# Patient Record
Sex: Male | Born: 1990 | Hispanic: Yes | Marital: Single | Smoking: Never smoker
Health system: Southern US, Community
[De-identification: ages and names within clinical notes are randomized; demographics above are authoritative.]

---
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
## 2013-11-05 ENCOUNTER — Emergency Department: Payer: Self-pay | Admitting: Emergency Medicine

## 2014-11-24 ENCOUNTER — Emergency Department
Admission: EM | Admit: 2014-11-24 | Discharge: 2014-11-24 | Disposition: A | Discharge status: Home / Self Care | Payer: Self-pay | Attending: Emergency Medicine | Admitting: Emergency Medicine

## 2014-11-24 DIAGNOSIS — R42 Dizziness and giddiness: Secondary | ICD-10-CM | POA: Insufficient documentation

## 2014-11-24 LAB — URINALYSIS COMPLETE WITH MICROSCOPIC (ARMC ONLY)
BILIRUBIN URINE: NEGATIVE
Bacteria, UA: NONE SEEN
GLUCOSE, UA: NEGATIVE mg/dL
Hgb urine dipstick: NEGATIVE
KETONES UR: NEGATIVE mg/dL
Nitrite: NEGATIVE
PH: 6 (ref 5.0–8.0)
Protein, ur: NEGATIVE mg/dL
SQUAMOUS EPITHELIAL / LPF: NONE SEEN
Specific Gravity, Urine: 1.005 (ref 1.005–1.030)

## 2014-11-24 LAB — CBC WITH DIFFERENTIAL/PLATELET
Basophils Absolute: 0 10*3/uL (ref 0–0.1)
Basophils Relative: 0 %
Eosinophils Absolute: 0.1 10*3/uL (ref 0–0.7)
Eosinophils Relative: 1 %
HCT: 49.5 % (ref 40.0–52.0)
Hemoglobin: 16.5 g/dL (ref 13.0–18.0)
LYMPHS PCT: 13 %
Lymphs Abs: 1 10*3/uL (ref 1.0–3.6)
MCH: 29.6 pg (ref 26.0–34.0)
MCHC: 33.3 g/dL (ref 32.0–36.0)
MCV: 88.9 fL (ref 80.0–100.0)
MONO ABS: 0.4 10*3/uL (ref 0.2–1.0)
Monocytes Relative: 6 %
NEUTROS ABS: 5.9 10*3/uL (ref 1.4–6.5)
Neutrophils Relative %: 80 %
PLATELETS: 172 10*3/uL (ref 150–440)
RBC: 5.57 MIL/uL (ref 4.40–5.90)
RDW: 12.4 % (ref 11.5–14.5)
WBC: 7.4 10*3/uL (ref 3.8–10.6)

## 2014-11-24 LAB — COMPREHENSIVE METABOLIC PANEL
ALK PHOS: 84 U/L (ref 38–126)
ALT: 17 U/L (ref 17–63)
AST: 21 U/L (ref 15–41)
Albumin: 4.4 g/dL (ref 3.5–5.0)
Anion gap: 7 (ref 5–15)
BILIRUBIN TOTAL: 2.2 mg/dL — AB (ref 0.3–1.2)
BUN: 18 mg/dL (ref 6–20)
CALCIUM: 9.3 mg/dL (ref 8.9–10.3)
CO2: 28 mmol/L (ref 22–32)
Chloride: 104 mmol/L (ref 101–111)
Creatinine, Ser: 1.04 mg/dL (ref 0.61–1.24)
GFR calc Af Amer: >60 mL/min (ref >60)
Glucose, Bld: 105 mg/dL — ABNORMAL HIGH (ref 65–99)
POTASSIUM: 3.8 mmol/L (ref 3.5–5.1)
Sodium: 139 mmol/L (ref 135–145)
TOTAL PROTEIN: 7.3 g/dL (ref 6.5–8.1)

## 2014-11-24 LAB — CK: CK TOTAL: 86 U/L (ref 49–397)

## 2014-11-24 MED ORDER — SODIUM CHLORIDE 0.9 % IV BOLUS (SEPSIS)
1000.0000 mL | Freq: Once | INTRAVENOUS | Status: AC
  Administered 2014-11-24: 1000 mL via INTRAVENOUS

## 2014-11-24 NOTE — ED Provider Notes (Signed)
[facility] Emergency Department Provider Note  ____________________________________________   I have reviewed the triage vital signs and the nursing notes.   HISTORY  Chief Complaint Near Syncope    HPI Nicholas Lucas is a 24 y.o. male who is healthy, did bruise his tailbone about a week ago and has been on tramadol and Motrin for this, he said he was in his normal state of health yesterday. He has not had any actual food to eat since a partially 4:00 yesterday or 18 hours ago. He had an apple yesterday evening. States he just did not get around to eating. He states he woke up this morning and felt lightheaded. EMS was called, appear that he may be having a panic attack. A friend, who is a doctor, thought that he may have had a vagal event. Patient this time has no symptoms but he did feel lightheaded again in the waiting room. He has had no vomiting he felt slightly nauseated. He has had no fever no chills no headache no stiff neck no GI bleed symptoms no melena no bright red blood per rectum or hematemesis. He has no complaints of true vertigo. He did not hit his head or actually pass out. His family has no history of early cardiac death. The patient has no symptoms of DVT and he has not been short of breath. He denies any focal neurologic deficit  History reviewed. No pertinent past medical history.  There are no active problems to display for this patient.   History reviewed. No pertinent past surgical history.  No current outpatient prescriptions on file.  Allergies Review of patient's allergies indicates no known allergies.  History reviewed. No pertinent family history.  Social History Social History  Substance Use Topics  . Smoking status: Never Smoker   . Smokeless tobacco: None  . Alcohol Use: No    Review of Systems Constitutional: No fever/chills Eyes: No visual changes. ENT: No sore throat. No stiff neck no neck pain Cardiovascular:  Denies chest pain. Respiratory: Denies shortness of breath. Gastrointestinal:   no vomiting.  No diarrhea.  No constipation. Genitourinary: Negative for dysuria. Musculoskeletal: Negative lower extremity swelling Skin: Negative for rash. Neurological: Negative for headaches, focal weakness or numbness. 10-point ROS otherwise negative.  ____________________________________________   PHYSICAL EXAM:  VITAL SIGNS: ED Triage Vitals  Enc Vitals Group     BP 11/24/14 0940 123/62 mmHg     Pulse Rate 11/24/14 0940 71     Resp 11/24/14 0940 16     Temp 11/24/14 0940 97.6 F (36.4 C)     Temp Source 11/24/14 0940 Oral     SpO2 11/24/14 0940 96 %     Weight 11/24/14 0940 160 lb (72.576 kg)     Height 11/24/14 0940 5\' 7"  (1.702 m)     Head Cir --      Peak Flow --      Pain Score --      Pain Loc --      Pain Edu? --      Excl. in GC? --     Constitutional: Alert and oriented. Well appearing and in no acute distress. smiling and talking to me with no evidence of discomfort or symptomology  Eyes: Conjunctivae are normal. PERRL. EOMI. Head: Atraumatic. Nose: No congestion/rhinnorhea. Mouth/Throat: Mucous membranes are moist.  Oropharynx non-erythematous. Neck: No stridor.   Nontender with no meningismus Cardiovascular: Normal rate, regular rhythm. Grossly normal heart sounds.  Good peripheral circulation. Respiratory: Normal  respiratory effort.  No retractions. Lungs CTAB. Gastrointestinal: Soft and nontender. No distention. No guarding no rebound Back:  There is no focal tenderness or step off there is no midline tenderness there are no lesions noted. there is no CVA tenderness Musculoskeletal: No lower extremity tenderness. No joint effusions, no DVT signs strong distal pulses no edema Cranial nerves II through XII are grossly intact 5 out of 5 strength bilateral upper and lower extremity. Finger to nose within normal limits heel to shin within normal limits, speech is normal with no  word finding difficulty ordered dysarthria, reflexes symmetric pupils are equally round and reactive to light there is no pronator drift, sensation is normal, normal neurologic exam Skin:  Skin is warm, dry and intact. No rash noted. Psychiatric: Mood and affect are normal. Speech and behavior are normal.  ____________________________________________   LABS (all labs ordered are listed, but only abnormal results are displayed)  Labs Reviewed  CBC WITH DIFFERENTIAL/PLATELET  CK  URINALYSIS COMPLETEWITH MICROSCOPIC ([facility] ONLY)  COMPREHENSIVE METABOLIC PANEL   ____________________________________________  EKG  I PERSONALLY INTERPRETED EKG ____________________________________________  RADIOLOGY   ____________________________________________   PROCEDURES  Procedure(s) performed: None  Critical Care performed: None  ____________________________________________   INITIAL IMPRESSION / ASSESSMENT AND PLAN / ED COURSE  Pertinent labs & imaging results that were available during my care of the patient were reviewed by me and considered in my medical decision making (see chart for detail)  Healthy young mfelt lightheaded this morning. Has not had anything to eat or drink and almost 20 hours besides an apple. We'll give him IV hydration, we'll check to make sure he does not have a long QT although there is no family history, we'll check basic blood work to make sure he does not have renal impairment from taking Motrin, his exam is otherwise reassuring his vital signs are good.   ____________________________________________   FINAL CLINICAL IMPRESSION(S) / ED DIAGNOSES  Final diagnoses:  None     Jeanmarie Plant, MD 11/24/14 1007

## 2014-11-24 NOTE — ED Notes (Signed)
Pt reports waking up, standing up to walk when he became dizzy and felt his heart racing.  Pt's mother reports that he became extremely pale and had pre-syncopal episode.  Pt is now still feeling slightly dizzy.

## 2014-11-24 NOTE — ED Notes (Addendum)
Patient states " I feel dizzy and like I could pass out". Patient reports dizzy episode 3 times this morning.  Patient called EMS but refused to comes in because he felt better.  Patient is currently taking Tramadol 50 mg PO and Naproxen 500 mg for sports related injury.  Patient has been taking medications since 11/15/2014.

## 2014-11-29 ENCOUNTER — Emergency Department: Payer: Self-pay

## 2014-11-29 ENCOUNTER — Emergency Department
Admission: EM | Admit: 2014-11-29 | Discharge: 2014-11-29 | Disposition: A | Discharge status: Home / Self Care | Payer: Self-pay | Attending: Emergency Medicine | Admitting: Emergency Medicine

## 2014-11-29 ENCOUNTER — Encounter: Payer: Self-pay | Admitting: Emergency Medicine

## 2014-11-29 DIAGNOSIS — R42 Dizziness and giddiness: Secondary | ICD-10-CM | POA: Insufficient documentation

## 2014-11-29 DIAGNOSIS — R17 Unspecified jaundice: Secondary | ICD-10-CM

## 2014-11-29 DIAGNOSIS — R6883 Chills (without fever): Secondary | ICD-10-CM | POA: Insufficient documentation

## 2014-11-29 DIAGNOSIS — R079 Chest pain, unspecified: Secondary | ICD-10-CM | POA: Insufficient documentation

## 2014-11-29 DIAGNOSIS — R0602 Shortness of breath: Secondary | ICD-10-CM | POA: Insufficient documentation

## 2014-11-29 LAB — CBC
HCT: 49.7 % (ref 40.0–52.0)
Hemoglobin: 17 g/dL (ref 13.0–18.0)
MCH: 30.2 pg (ref 26.0–34.0)
MCHC: 34.1 g/dL (ref 32.0–36.0)
MCV: 88.5 fL (ref 80.0–100.0)
PLATELETS: 190 10*3/uL (ref 150–440)
RBC: 5.62 MIL/uL (ref 4.40–5.90)
RDW: 12.5 % (ref 11.5–14.5)
WBC: 7.8 10*3/uL (ref 3.8–10.6)

## 2014-11-29 LAB — COMPREHENSIVE METABOLIC PANEL
ALT: 16 U/L — ABNORMAL LOW (ref 17–63)
ANION GAP: 6 (ref 5–15)
AST: 18 U/L (ref 15–41)
Albumin: 4.8 g/dL (ref 3.5–5.0)
Alkaline Phosphatase: 83 U/L (ref 38–126)
BILIRUBIN TOTAL: 2 mg/dL — AB (ref 0.3–1.2)
BUN: 16 mg/dL (ref 6–20)
CHLORIDE: 105 mmol/L (ref 101–111)
CO2: 29 mmol/L (ref 22–32)
Calcium: 9.6 mg/dL (ref 8.9–10.3)
Creatinine, Ser: 0.89 mg/dL (ref 0.61–1.24)
GFR calc Af Amer: >60 mL/min (ref >60)
Glucose, Bld: 105 mg/dL — ABNORMAL HIGH (ref 65–99)
POTASSIUM: 4.4 mmol/L (ref 3.5–5.1)
Sodium: 140 mmol/L (ref 135–145)
TOTAL PROTEIN: 7.4 g/dL (ref 6.5–8.1)

## 2014-11-29 LAB — TROPONIN I

## 2014-11-29 NOTE — ED Provider Notes (Signed)
[facility] Emergency Department Provider Note  ____________________________________________  Time seen: Approximately 614 AM  I have reviewed the triage vital signs and the nursing notes.   HISTORY  Chief Complaint Chest Pain   HPI Nicholas HatterJorge Lucas is a 24 y.o. male who comes into the hospital with chest pain. The patient reports that while he was sitting and watching TV around 10 or 11 he started having some chest pain and felt as though his heart rate was slowing down. The patient reports that he was here for the same thing last week. He reports that when he has the symptoms he feels chills starting from his feet all the way up his body and his heart start beating fast and then he has some chest pain. The patient reports that he's had constant chest pain since last week and is a 6 out of 10 in intensity. The patient reports that the pain is sharp. The patient has not followed up with cardiology as he thought the pain would just go away and he was waiting it out. The patient reports that he feels sore in his chest. He denies any nausea or vomiting but has had some dizziness. The patient reports that he had been taking pain medication prior to this and has had some constipation due to that. The patient reports he is also had some mild shortness of breath with no pain with inspiration.   History reviewed. No pertinent past medical history.  There are no active problems to display for this patient.   History reviewed. No pertinent past surgical history.  Current Outpatient Rx  Name  Route  Sig  Dispense  Refill  . naproxen (NAPROSYN) 500 MG tablet   Oral   Take 500 mg by mouth 2 (two) times daily as needed for mild pain or moderate pain.          . traMADol (ULTRAM) 50 MG tablet   Oral   Take 50 mg by mouth every 6 (six) hours as needed for moderate pain or severe pain.           Allergies Review of patient's allergies indicates no known allergies.  No  family history on file.  Social History Social History  Substance Use Topics  . Smoking status: Never Smoker   . Smokeless tobacco: Never Used  . Alcohol Use: No    Review of Systems Constitutional: chills Eyes: No visual changes. ENT: No sore throat. Cardiovascular:chest pain. Respiratory:  shortness of breath. Gastrointestinal: No abdominal pain.  No nausea, no vomiting.  No diarrhea.  No constipation. Genitourinary: Negative for dysuria. Musculoskeletal: Negative for back pain. Skin: Negative for rash. Neurological: Dizziness 10-point ROS otherwise negative.  ____________________________________________   PHYSICAL EXAM:  VITAL SIGNS: ED Triage Vitals  Enc Vitals Group     BP 11/29/14 0214 125/72 mmHg     Pulse Rate 11/29/14 0214 69     Resp 11/29/14 0214 22     Temp 11/29/14 0214 98.4 F (36.9 C)     Temp Source 11/29/14 0214 Oral     SpO2 11/29/14 0214 99 %     Weight 11/29/14 0214 160 lb (72.576 kg)     Height 11/29/14 0214 5\' 7"  (1.702 m)     Head Cir --      Peak Flow --      Pain Score 11/29/14 0211 6     Pain Loc --      Pain Edu? --  Excl. in GC? --     Constitutional: Alert and oriented. Well appearing and in mild distress. Eyes: Conjunctivae are normal. PERRL. EOMI. Head: Atraumatic. Nose: No congestion/rhinnorhea. Mouth/Throat: Mucous membranes are moist.  Oropharynx non-erythematous. Cardiovascular: Normal rate, regular rhythm. Grossly normal heart sounds.  Good peripheral circulation. Respiratory: Normal respiratory effort.  No retractions. Lungs CTAB. Gastrointestinal: Soft and nontender. No distention. Positive bowel sounds Musculoskeletal: No lower extremity tenderness nor edema.   Neurologic:  Normal speech and language. No gross focal neurologic deficits are appreciated.  Skin:  Skin is warm, dry and intact.  Psychiatric: Mood and affect are normal.   ____________________________________________   LABS (all labs ordered are  listed, but only abnormal results are displayed)  Labs Reviewed  COMPREHENSIVE METABOLIC PANEL - Abnormal; Notable for the following:    Glucose, Bld 105 (*)    ALT 16 (*)    Total Bilirubin 2.0 (*)    All other components within normal limits  CBC  TROPONIN I   ____________________________________________  EKG  ED ECG REPORT I, Rebecka Apley, the attending physician, personally viewed and interpreted this ECG.   Date: 11/29/2014  EKG Time: 213  Rate: 87  Rhythm: normal sinus rhythm  Axis: normal  Intervals:none  ST&T Change: none  ____________________________________________  RADIOLOGY  CXR: No active cardiopulmonary disease [place] abd: Negatve ____________________________________________   PROCEDURES  Procedure(s) performed: None  Critical Care performed: No  ____________________________________________   INITIAL IMPRESSION / ASSESSMENT AND PLAN / ED COURSE  Pertinent labs & imaging results that were available during my care of the patient were reviewed by me and considered in my medical decision making (see chart for details).  This is a 24 year old male who comes in today with some chest pain. The patient reports that she did have these symptoms last week. He reports that he started having chills while watching TV this evening around 10 PM. The patient has had continued chest pain but has not followed up as he has been instructed to do by the physician who saw him last week. The patient's blood work is unremarkable and he is resting calmly without any acute distress at this time. I will discharge the patient to home and again encouraged him to follow up with cardiology for further evaluation of his chest pain. ____________________________________________   FINAL CLINICAL IMPRESSION(S) / ED DIAGNOSES  Final diagnoses:  Elevated bilirubin  Chest pain, unspecified chest pain type  Chills      Rebecka Apley, MD 11/29/14 [phone]

## 2014-11-29 NOTE — ED Notes (Signed)
Patient reports seen in ED last week for chest pain and near syncopal episode.  Patient reports continued upper chest pain since then that has been constant. Patient ambulatory without difficulty, speaking in complete sentences.

## 2014-11-29 NOTE — ED Notes (Signed)
Pt resting quietly in lobby with eyes closed, resp even and nonlabored 

## 2014-11-29 NOTE — Discharge Instructions (Signed)
Nonspecific Chest Pain  °Chest pain can be caused by many different conditions. There is always a chance that your pain could be related to something serious, such as a heart attack or a blood clot in your lungs. Chest pain can also be caused by conditions that are not life-threatening. If you have chest pain, it is very important to follow up with your health care provider. °CAUSES  °Chest pain can be caused by: °· Heartburn. °· Pneumonia or bronchitis. °· Anxiety or stress. °· Inflammation around your heart (pericarditis) or lung (pleuritis or pleurisy). °· A blood clot in your lung. °· A collapsed lung (pneumothorax). It can develop suddenly on its own (spontaneous pneumothorax) or from trauma to the chest. °· Shingles infection (varicella-zoster virus). °· Heart attack. °· Damage to the bones, muscles, and cartilage that make up your chest wall. This can include: °¨ Bruised bones due to injury. °¨ Strained muscles or cartilage due to frequent or repeated coughing or overwork. °¨ Fracture to one or more ribs. °¨ Sore cartilage due to inflammation (costochondritis). °RISK FACTORS  °Risk factors for chest pain may include: °· Activities that increase your risk for trauma or injury to your chest. °· Respiratory infections or conditions that cause frequent coughing. °· Medical conditions or overeating that can cause heartburn. °· Heart disease or family history of heart disease. °· Conditions or health behaviors that increase your risk of developing a blood clot. °· Having had chicken pox (varicella zoster). °SIGNS AND SYMPTOMS °Chest pain can feel like: °· Burning or tingling on the surface of your chest or deep in your chest. °· Crushing, pressure, aching, or squeezing pain. °· Dull or sharp pain that is worse when you move, cough, or take a deep breath. °· Pain that is also felt in your back, neck, shoulder, or arm, or pain that spreads to any of these areas. °Your chest pain may come and go, or it may stay  constant. °DIAGNOSIS °Lab tests or other studies may be needed to find the cause of your pain. Your health care provider may have you take a test called an ambulatory ECG (electrocardiogram). An ECG records your heartbeat patterns at the time the test is performed. You may also have other tests, such as: °· Transthoracic echocardiogram (TTE). During echocardiography, sound waves are used to create a picture of all of the heart structures and to look at how blood flows through your heart. °· Transesophageal echocardiogram (TEE). This is a more advanced imaging test that obtains images from inside your body. It allows your health care provider to see your heart in finer detail. °· Cardiac monitoring. This allows your health care provider to monitor your heart rate and rhythm in real time. °· Holter monitor. This is a portable device that records your heartbeat and can help to diagnose abnormal heartbeats. It allows your health care provider to track your heart activity for several days, if needed. °· Stress tests. These can be done through exercise or by taking medicine that makes your heart beat more quickly. °· Blood tests. °· Imaging tests. °TREATMENT  °Your treatment depends on what is causing your chest pain. Treatment may include: °· Medicines. These may include: °¨ Acid blockers for heartburn. °¨ Anti-inflammatory medicine. °¨ Pain medicine for inflammatory conditions. °¨ Antibiotic medicine, if an infection is present. °¨ Medicines to dissolve blood clots. °¨ Medicines to treat coronary artery disease. °· Supportive care for conditions that do not require medicines. This may include: °¨ Resting. °¨ Applying heat   or cold packs to injured areas. °¨ Limiting activities until pain decreases. °HOME CARE INSTRUCTIONS °· If you were prescribed an antibiotic medicine, finish it all even if you start to feel better. °· Avoid any activities that bring on chest pain. °· Do not use any tobacco products, including  cigarettes, chewing tobacco, or electronic cigarettes. If you need help quitting, ask your health care provider. °· Do not drink alcohol. °· Take medicines only as directed by your health care provider. °· Keep all follow-up visits as directed by your health care provider. This is important. This includes any further testing if your chest pain does not go away. °· If heartburn is the cause for your chest pain, you may be told to keep your head raised (elevated) while sleeping. This reduces the chance that acid will go from your stomach into your esophagus. °· Make lifestyle changes as directed by your health care provider. These may include: °¨ Getting regular exercise. Ask your health care provider to suggest some activities that are safe for you. °¨ Eating a heart-healthy diet. A registered dietitian can help you to learn healthy eating options. °¨ Maintaining a healthy weight. °¨ Managing diabetes, if necessary. °¨ Reducing stress. °SEEK MEDICAL CARE IF: °· Your chest pain does not go away after treatment. °· You have a rash with blisters on your chest. °· You have a fever. °SEEK IMMEDIATE MEDICAL CARE IF:  °· Your chest pain is worse. °· You have an increasing cough, or you cough up blood. °· You have severe abdominal pain. °· You have severe weakness. °· You faint. °· You have chills. °· You have sudden, unexplained chest discomfort. °· You have sudden, unexplained discomfort in your arms, back, neck, or jaw. °· You have shortness of breath at any time. °· You suddenly start to sweat, or your skin gets clammy. °· You feel nauseous or you vomit. °· You suddenly feel light-headed or dizzy. °· Your heart begins to beat quickly, or it feels like it is skipping beats. °These symptoms may represent a serious problem that is an emergency. Do not wait to see if the symptoms will go away. Get medical help right away. Call your local emergency services (911 in the [place] Do not drive yourself to the hospital. °  °This  information is not intended to replace advice given to you by your health care provider. Make sure you discuss any questions you have with your health care provider. °  °Document Released: 10/25/2004 Document Revised: 02/05/2014 Document Reviewed: 08/21/2013 °Elsevier Interactive Patient Education ©2016 [employer] ° °

## 2016-04-02 IMAGING — US US ABDOMEN LIMITED
1 series · 14 of 25 positions shown · non-contrast
Comparison: None.

CLINICAL DATA: Elevated liver function tests. Atypical chest pain
for 1 week.

EXAM:
US ABDOMEN LIMITED - RIGHT UPPER QUADRANT

[Series 1: us abdomen limited · 0.18mm/px · 14 of 34 slices shown]
[im 1/34]
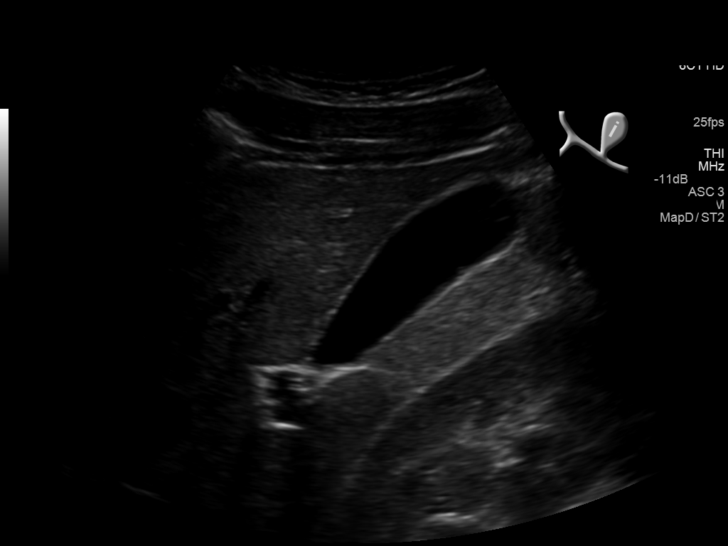
[im 3/34]
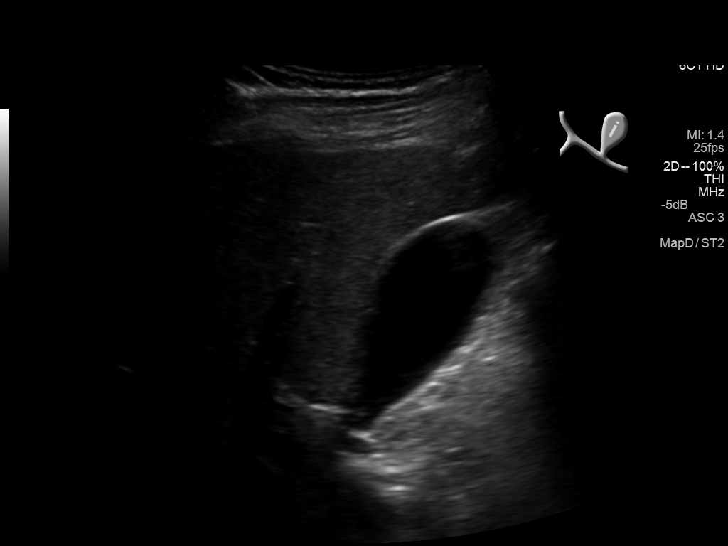
[im 6/34]
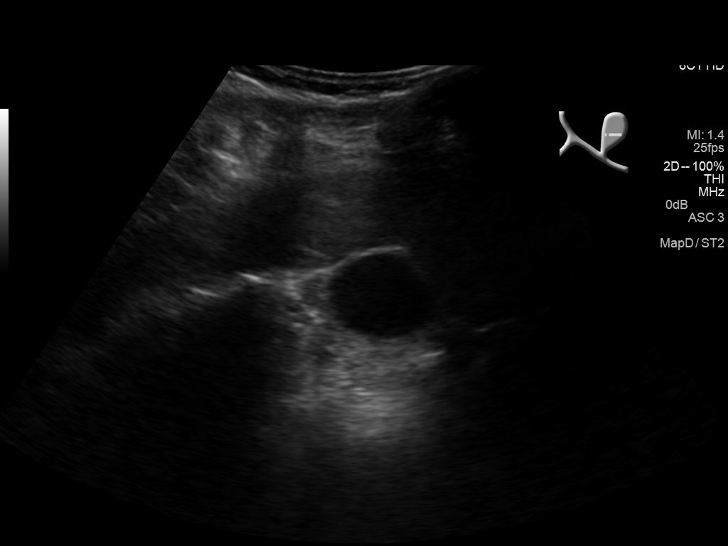
[im 9/34]
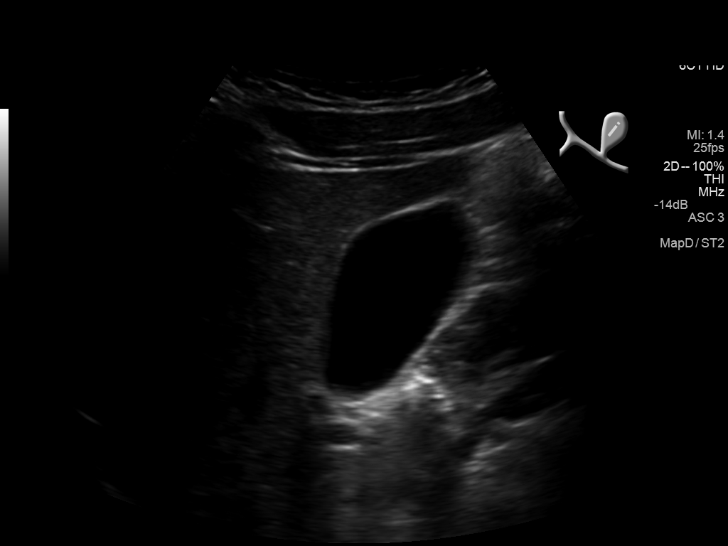
[im 12/34]
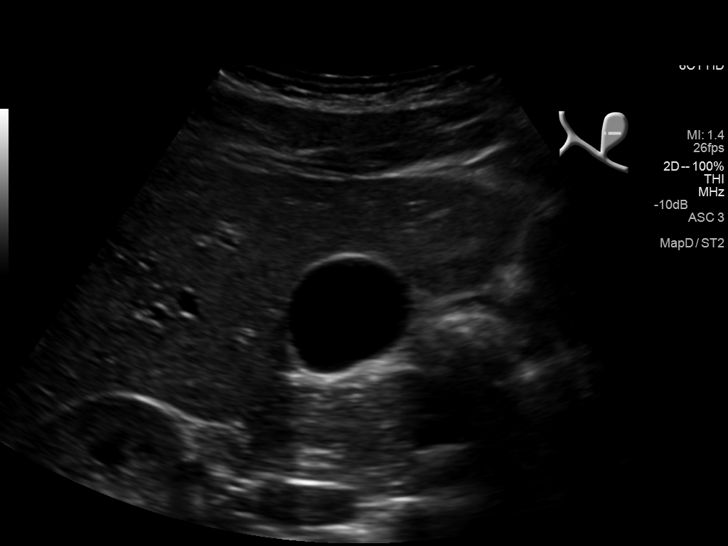
[im 13/34]
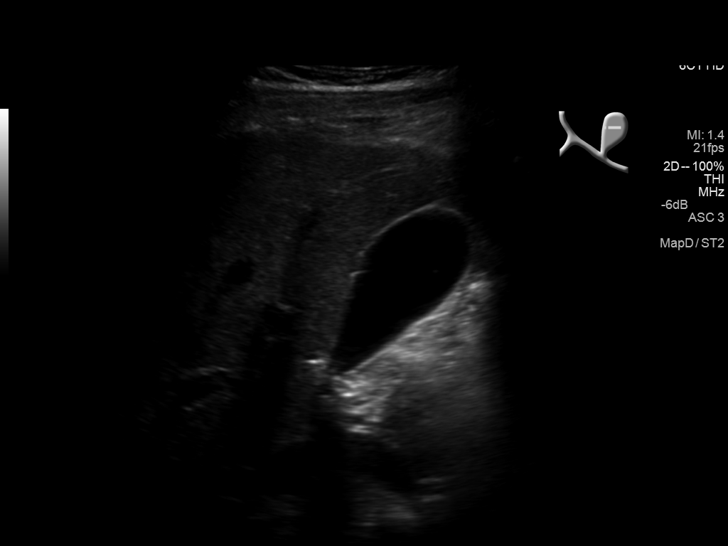
[im 16/34]
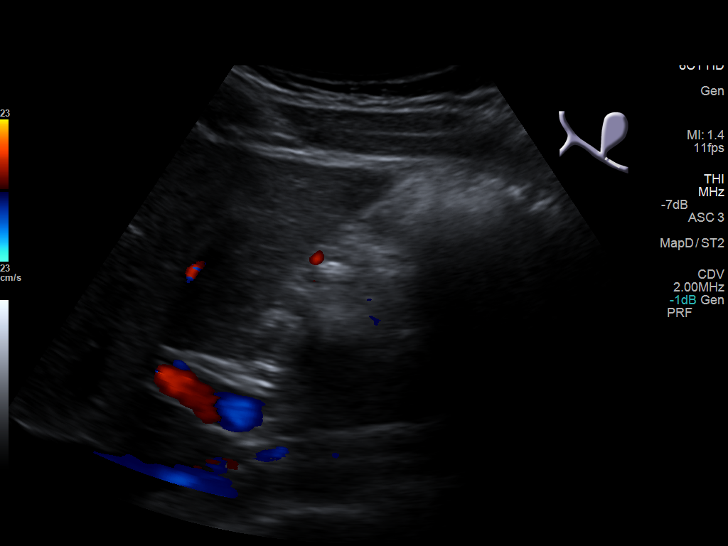
[im 18/34]
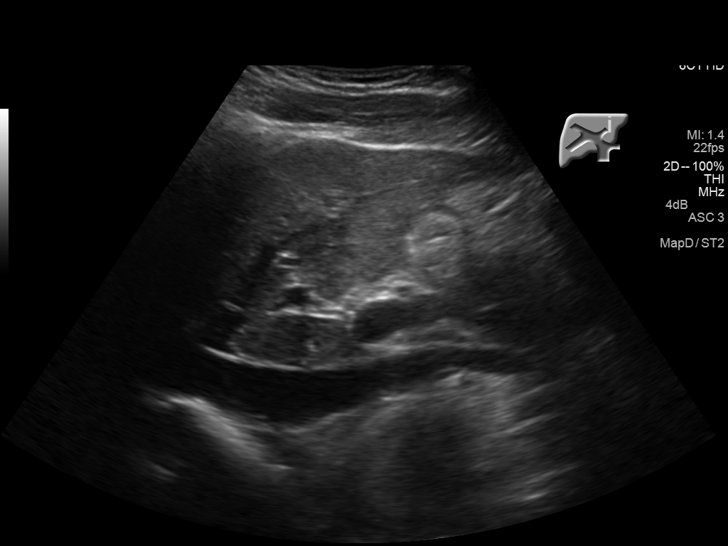
[im 21/34]
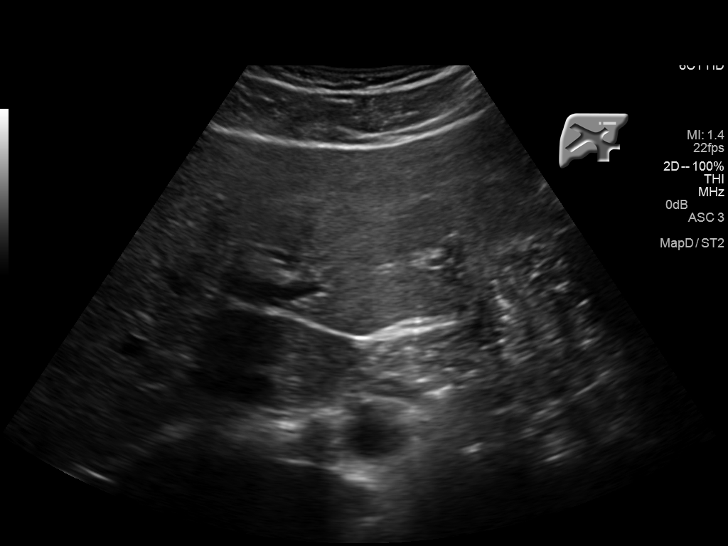
[im 23/34]
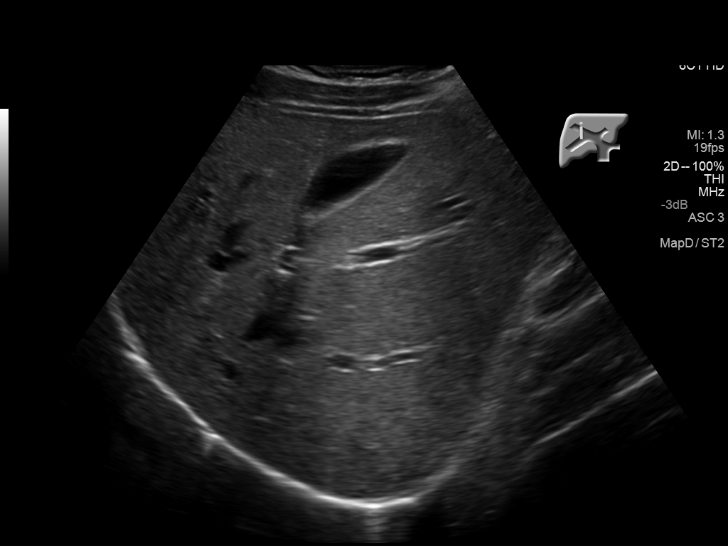
[im 25/34]
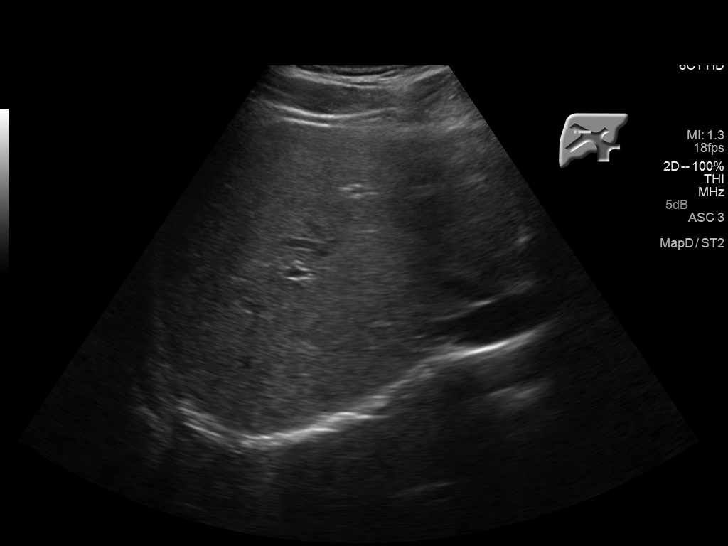
[im 28/34]
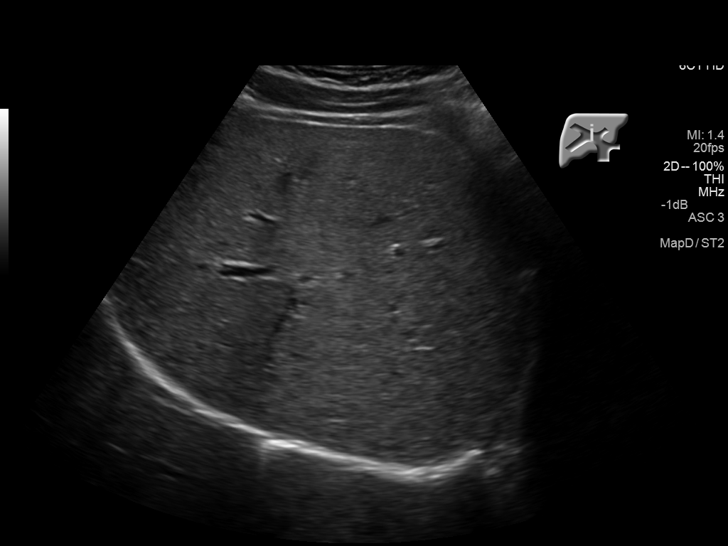
[im 31/34]
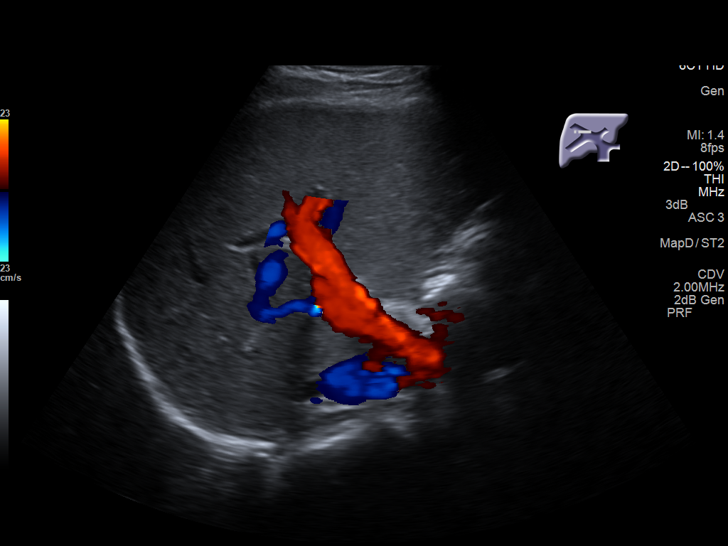
[im 34/34]
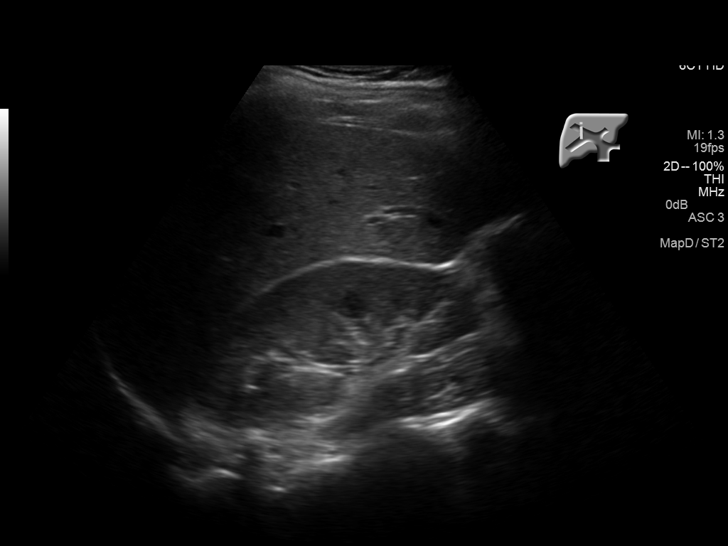

[14 of 25 positions shown; findings below may reference images not displayed]

FINDINGS: Gallbladder:

No gallstones or wall thickening visualized. No sonographic Murphy
sign noted.

Common bile duct:

Diameter: 3 mm, within normal limits

Liver:

No focal lesion identified. Within normal limits in parenchymal
echogenicity.
IMPRESSION: Negative.

## 2018-04-05 ENCOUNTER — Encounter: Payer: Self-pay | Admitting: Emergency Medicine

## 2018-04-05 ENCOUNTER — Emergency Department: Payer: BLUE CROSS/BLUE SHIELD

## 2018-04-05 ENCOUNTER — Emergency Department
Admission: EM | Admit: 2018-04-05 | Discharge: 2018-04-05 | Disposition: A | Discharge status: Home / Self Care | Payer: BLUE CROSS/BLUE SHIELD | Attending: Emergency Medicine | Admitting: Emergency Medicine

## 2018-04-05 DIAGNOSIS — F419 Anxiety disorder, unspecified: Secondary | ICD-10-CM | POA: Diagnosis not present

## 2018-04-05 DIAGNOSIS — Z79899 Other long term (current) drug therapy: Secondary | ICD-10-CM | POA: Diagnosis not present

## 2018-04-05 DIAGNOSIS — K219 Gastro-esophageal reflux disease without esophagitis: Secondary | ICD-10-CM

## 2018-04-05 DIAGNOSIS — J40 Bronchitis, not specified as acute or chronic: Secondary | ICD-10-CM

## 2018-04-05 DIAGNOSIS — R079 Chest pain, unspecified: Secondary | ICD-10-CM

## 2018-04-05 LAB — CBC
HEMATOCRIT: 51.4 % (ref 39.0–52.0)
Hemoglobin: 17.6 g/dL — ABNORMAL HIGH (ref 13.0–17.0)
MCH: 30.1 pg (ref 26.0–34.0)
MCHC: 34.2 g/dL (ref 30.0–36.0)
MCV: 87.9 fL (ref 80.0–100.0)
NRBC: 0 % (ref 0.0–0.2)
Platelets: 215 10*3/uL (ref 150–400)
RBC: 5.85 MIL/uL — ABNORMAL HIGH (ref 4.22–5.81)
RDW: 11.6 % (ref 11.5–15.5)
WBC: 6 10*3/uL (ref 4.0–10.5)

## 2018-04-05 LAB — BASIC METABOLIC PANEL
Anion gap: 9 (ref 5–15)
BUN: 10 mg/dL (ref 6–20)
CHLORIDE: 108 mmol/L (ref 98–111)
CO2: 22 mmol/L (ref 22–32)
CREATININE: 0.86 mg/dL (ref 0.61–1.24)
Calcium: 9.5 mg/dL (ref 8.9–10.3)
GFR calc non Af Amer: >60 mL/min (ref >60)
Glucose, Bld: 105 mg/dL — ABNORMAL HIGH (ref 70–99)
Potassium: 3.5 mmol/L (ref 3.5–5.1)
SODIUM: 139 mmol/L (ref 135–145)

## 2018-04-05 LAB — TROPONIN I

## 2018-04-05 MED ORDER — PREDNISONE 50 MG PO TABS: 50.0000 mg | ORAL_TABLET | Freq: Every day | ORAL | 0 refills | Status: AC

## 2018-04-05 MED ORDER — SODIUM CHLORIDE 0.9% FLUSH: 3.0000 mL | Freq: Once | INTRAVENOUS | Status: DC

## 2018-04-05 MED ORDER — OMEPRAZOLE 10 MG PO CPDR: 10.0000 mg | DELAYED_RELEASE_CAPSULE | Freq: Every day | ORAL | 0 refills | Status: AC

## 2018-04-05 MED ORDER — ALBUTEROL SULFATE HFA 108 (90 BASE) MCG/ACT IN AERS: 2.0000 | INHALATION_SPRAY | RESPIRATORY_TRACT | 0 refills | Status: AC | PRN

## 2018-04-05 MED ORDER — BENZONATATE 100 MG PO CAPS: 100.0000 mg | ORAL_CAPSULE | Freq: Four times a day (QID) | ORAL | 0 refills | Status: AC | PRN

## 2018-04-05 NOTE — ED Provider Notes (Signed)
[facility] Emergency Department Provider Note  ____________________________________________  Time seen: Approximately 5:35 PM  I have reviewed the triage vital signs and the nursing notes.   HISTORY  Chief Complaint Chest Pain    HPI Nicholas Lucas is a 28 y.o. male who presents the emergency department complaining of intermittent, nonspecific chest pain in addition to nasal congestion, sore throat x2 weeks.  Patient reports that he has been experiencing intermittent chest pain over the past several days.  Preceding this he has had 2-week history of nonproductive, dry cough.  Patient denies any fevers or chills, significant sore throat, abdominal pain, nausea or vomiting.  Patient reports that he does have a history of anxiety with similar chest pain as well as a history of GERD with similar chest pain.  Patient denies any trauma to the chest.  He denies any edema, erythema lower extremity.  No history of DVT or PE.  Patient is not taking any medications for his complaint prior to arrival.   HPI: A 28 year old patient presents for evaluation of chest pain. Initial onset of pain was more than 6 hours ago. The patient's chest pain is not worse with exertion. The patient's chest pain is not middle- or left-sided, is not well-localized, is not described as heaviness/pressure/tightness, is not sharp and does not radiate to the arms/jaw/neck. The patient does not complain of nausea and denies diaphoresis. The patient has no history of stroke, has no history of peripheral artery disease, has not smoked in the past 90 days, denies any history of treated diabetes, has no relevant family history of coronary artery disease (first degree relative at less than age 33), is not hypertensive, has no history of hypercholesterolemia and does not have an elevated BMI (>=30).      History reviewed. No pertinent past medical history.  There are no active problems to display for this  patient.   History reviewed. No pertinent surgical history.  Prior to Admission medications   Medication Sig Start Date End Date Taking? Authorizing Provider  albuterol (PROVENTIL HFA;VENTOLIN HFA) 108 (90 Base) MCG/ACT inhaler Inhale 2 puffs into the lungs every 4 (four) hours as needed for wheezing or shortness of breath. 04/05/18   Nicholas Lucas, Nicholas Royals, PA-C  benzonatate (TESSALON PERLES) 100 MG capsule Take 1 capsule (100 mg total) by mouth every 6 (six) hours as needed for cough. 04/05/18 04/05/19  Nicholas Lucas, Nicholas Royals, PA-C  naproxen (NAPROSYN) 500 MG tablet Take 500 mg by mouth 2 (two) times daily as needed for mild pain or moderate pain.     [provider]  omeprazole (PRILOSEC) 10 MG capsule Take 1 capsule (10 mg total) by mouth daily. 04/05/18   Nicholas Lucas, Nicholas Royals, PA-C  predniSONE (DELTASONE) 50 MG tablet Take 1 tablet (50 mg total) by mouth daily with breakfast. 04/05/18   Nicholas Lucas, Nicholas Royals, PA-C  traMADol (ULTRAM) 50 MG tablet Take 50 mg by mouth every 6 (six) hours as needed for moderate pain or severe pain.    [provider]    Allergies Patient has no known allergies.  History reviewed. No pertinent family history.  Social History Social History   Tobacco Use  . Smoking status: Never Smoker  . Smokeless tobacco: Never Used  Substance Use Topics  . Alcohol use: No  . Drug use: No     Review of Systems  Constitutional: No fever/chills Eyes: No visual changes. No discharge ENT: Intermittent mild sore throat and nasal congestion Cardiovascular: Diffuse, nonspecific burning chest  pain. Respiratory: 2-week history of cough. No SOB. Gastrointestinal: No abdominal pain.  No nausea, no vomiting.  No diarrhea.  No constipation. Musculoskeletal: Negative for musculoskeletal pain. Skin: Negative for rash, abrasions, lacerations, ecchymosis. Neurological: Negative for headaches, focal weakness or numbness. 10-point ROS otherwise  negative.  ____________________________________________   PHYSICAL EXAM:  VITAL SIGNS: ED Triage Vitals  Enc Vitals Group     BP 04/05/18 1520 (!) 143/71     Pulse Rate 04/05/18 1520 87     Resp 04/05/18 1520 18     Temp 04/05/18 1520 97.8 F (36.6 C)     Temp Source 04/05/18 1520 Oral     SpO2 04/05/18 1520 98 %     Weight --      Height 04/05/18 1524  (1.676 m)     Head Circumference --      Peak Flow --      Pain Score 04/05/18 1523 4     Pain Loc --      Pain Edu? --      Excl. in GC? --      Constitutional: Alert and oriented. Well appearing and in no acute distress. Eyes: Conjunctivae are normal. PERRL. EOMI. Head: Atraumatic. ENT:      Ears: EACs and TMs unremarkable bilaterally.      Nose: No congestion/rhinnorhea.      Mouth/Throat: Mucous membranes are moist.  Oropharynx is nonerythematous and nonedematous.  Uvula is midline. Neck: No stridor.  Neck is supple full range of motion Hematological/Lymphatic/Immunilogical: No cervical lymphadenopathy. Cardiovascular: Normal rate, regular rhythm. Normal S1 and S2.  No murmurs, rubs, gallops.  Good peripheral circulation. Respiratory: Normal respiratory effort without tachypnea or retractions. Lungs CTAB. Good air entry to the bases with no decreased or absent breath sounds. Musculoskeletal: Full range of motion to all extremities. No gross deformities appreciated. Neurologic:  Normal speech and language. No gross focal neurologic deficits are appreciated.  Skin:  Skin is warm, dry and intact. No rash noted. Psychiatric: Patient is visibly anxious but affect is normal. Speech and behavior are normal. Patient exhibits appropriate insight and judgement.   ____________________________________________   LABS (all labs ordered are listed, but only abnormal results are displayed)  Labs Reviewed  BASIC METABOLIC PANEL - Abnormal; Notable for the following components:      Result Value   Glucose, Bld 105 (*)     All other components within normal limits  CBC - Abnormal; Notable for the following components:   RBC 5.85 (*)    Hemoglobin 17.6 (*)    All other components within normal limits  TROPONIN I   ____________________________________________  EKG  ED ECG REPORT I, Nicholas Lucas Sakib Noguez,  personally viewed and interpreted this ECG.   Date: 04/05/2018  EKG Time: 1525  Rate: 104  Rhythm: unchanged from previous tracings, sinus tachycardia  Axis: Left axis deviation  Intervals:none  ST&T Change: No ST elevation or depression noted.  Biphasic P wave in V1 consistent with right atrial enlargement.  EKG unchanged from previous.  ____________________________________________  RADIOLOGY I personally viewed and evaluated these images as part of my medical decision making, as well as reviewing the written report by the radiologist.  Dg Chest 2 View  Result Date: 04/05/2018 CLINICAL DATA:  Chest pain EXAM: CHEST - 2 VIEW COMPARISON:  11/29/2014 FINDINGS: Heart and mediastinal contours are within normal limits. No focal opacities or effusions. No acute bony abnormality. IMPRESSION: No active cardiopulmonary disease. Electronically Signed   By: Caryn Bee  [place] M.D.   On: 04/05/2018 16:40    ____________________________________________    PROCEDURES  Procedure(s) performed:    Procedures    Medications  sodium chloride flush (NS) 0.9 % injection 3 mL (has no administration in time range)     ____________________________________________   INITIAL IMPRESSION / ASSESSMENT AND PLAN / ED COURSE  Pertinent labs & imaging results that were available during my care of the patient were reviewed by me and considered in my medical decision making (see chart for details).  Review of the [place] CSRS was performed in accordance of the [facility] prior to dispensing any controlled drugs.     HEAR Score: 0     Patient's diagnosis is consistent with bronchitis, nonspecific chest pain.  Patient  presented to the emergency department with complaint of chest pain.  He has had similar pain in the past associated with anxiety and GERD.  At this time, I suspect that symptoms are most consistent with bronchitis as well as anxiety causing nonspecific chest pain.  Overall, exam is reassuring.  Labs are reassuring with reassuring CBC, CMP, normal troponin.  EKG and chest x-ray is also reassuring.  Patient will be treated with prednisone, albuterol, Tessalon Perles for bronchitis, Prilosec for GERD symptoms.  Patient verbalizes understanding of his diagnosis and appears much relieved.  Differential included PE, ACS, musculoskeletal chest pain, costochondritis, bronchitis, panic attack.  Follow-up with primary care as needed..  Patient is given ED precautions to return to the ED for any worsening or new symptoms.     ____________________________________________  FINAL CLINICAL IMPRESSION(S) / ED DIAGNOSES  Final diagnoses:  Bronchitis  Anxiety  Gastroesophageal reflux disease, esophagitis presence not specified  Nonspecific chest pain      NEW MEDICATIONS STARTED DURING THIS VISIT:  ED Discharge Orders         Ordered    predniSONE (DELTASONE) 50 MG tablet  Daily with breakfast     04/05/18 1809    albuterol (PROVENTIL HFA;VENTOLIN HFA) 108 (90 Base) MCG/ACT inhaler  Every 4 hours PRN     04/05/18 1809    benzonatate (TESSALON PERLES) 100 MG capsule  Every 6 hours PRN     04/05/18 1809    omeprazole (PRILOSEC) 10 MG capsule  Daily     04/05/18 1809              This chart was dictated using voice recognition software/Dragon. Despite best efforts to proofread, errors can occur which can change the meaning. Any change was purely unintentional.    Racheal Patches, PA-C 04/05/18 1810    Phineas Semen, MD 04/05/18 [phone]

## 2018-04-05 NOTE — ED Triage Notes (Addendum)
Pt to ED with c/o of chest pain x2 days. Pt state pain comes and goes. Pt denies dizziness/lightheadness, N/V. Pt also with c/o of dry cough for approx 2 weeks.

## 2019-03-30 ENCOUNTER — Other Ambulatory Visit
Admission: RE | Admit: 2019-03-30 | Discharge: 2019-03-30 | Disposition: A | Discharge status: Home / Self Care | Payer: 59 | Source: Ambulatory Visit | Attending: Pediatrics | Admitting: Pediatrics

## 2019-03-30 DIAGNOSIS — R0602 Shortness of breath: Secondary | ICD-10-CM | POA: Diagnosis not present

## 2019-03-30 LAB — FIBRIN DERIVATIVES D-DIMER (ARMC ONLY): Fibrin derivatives D-dimer (ARMC): 92.18 ng/mL (FEU) (ref 0.00–499.00)

## 2020-07-17 IMAGING — CR DG CHEST 2V
1 series · 2 of 2 positions shown · non-contrast
Comparison: 11/29/2014

CLINICAL DATA: Chest pain

EXAM:
CHEST - 2 VIEW

[Series 1: dg chest 2 view · 0.14mm/px · 2 of 2 slices shown]
[im 1/2]
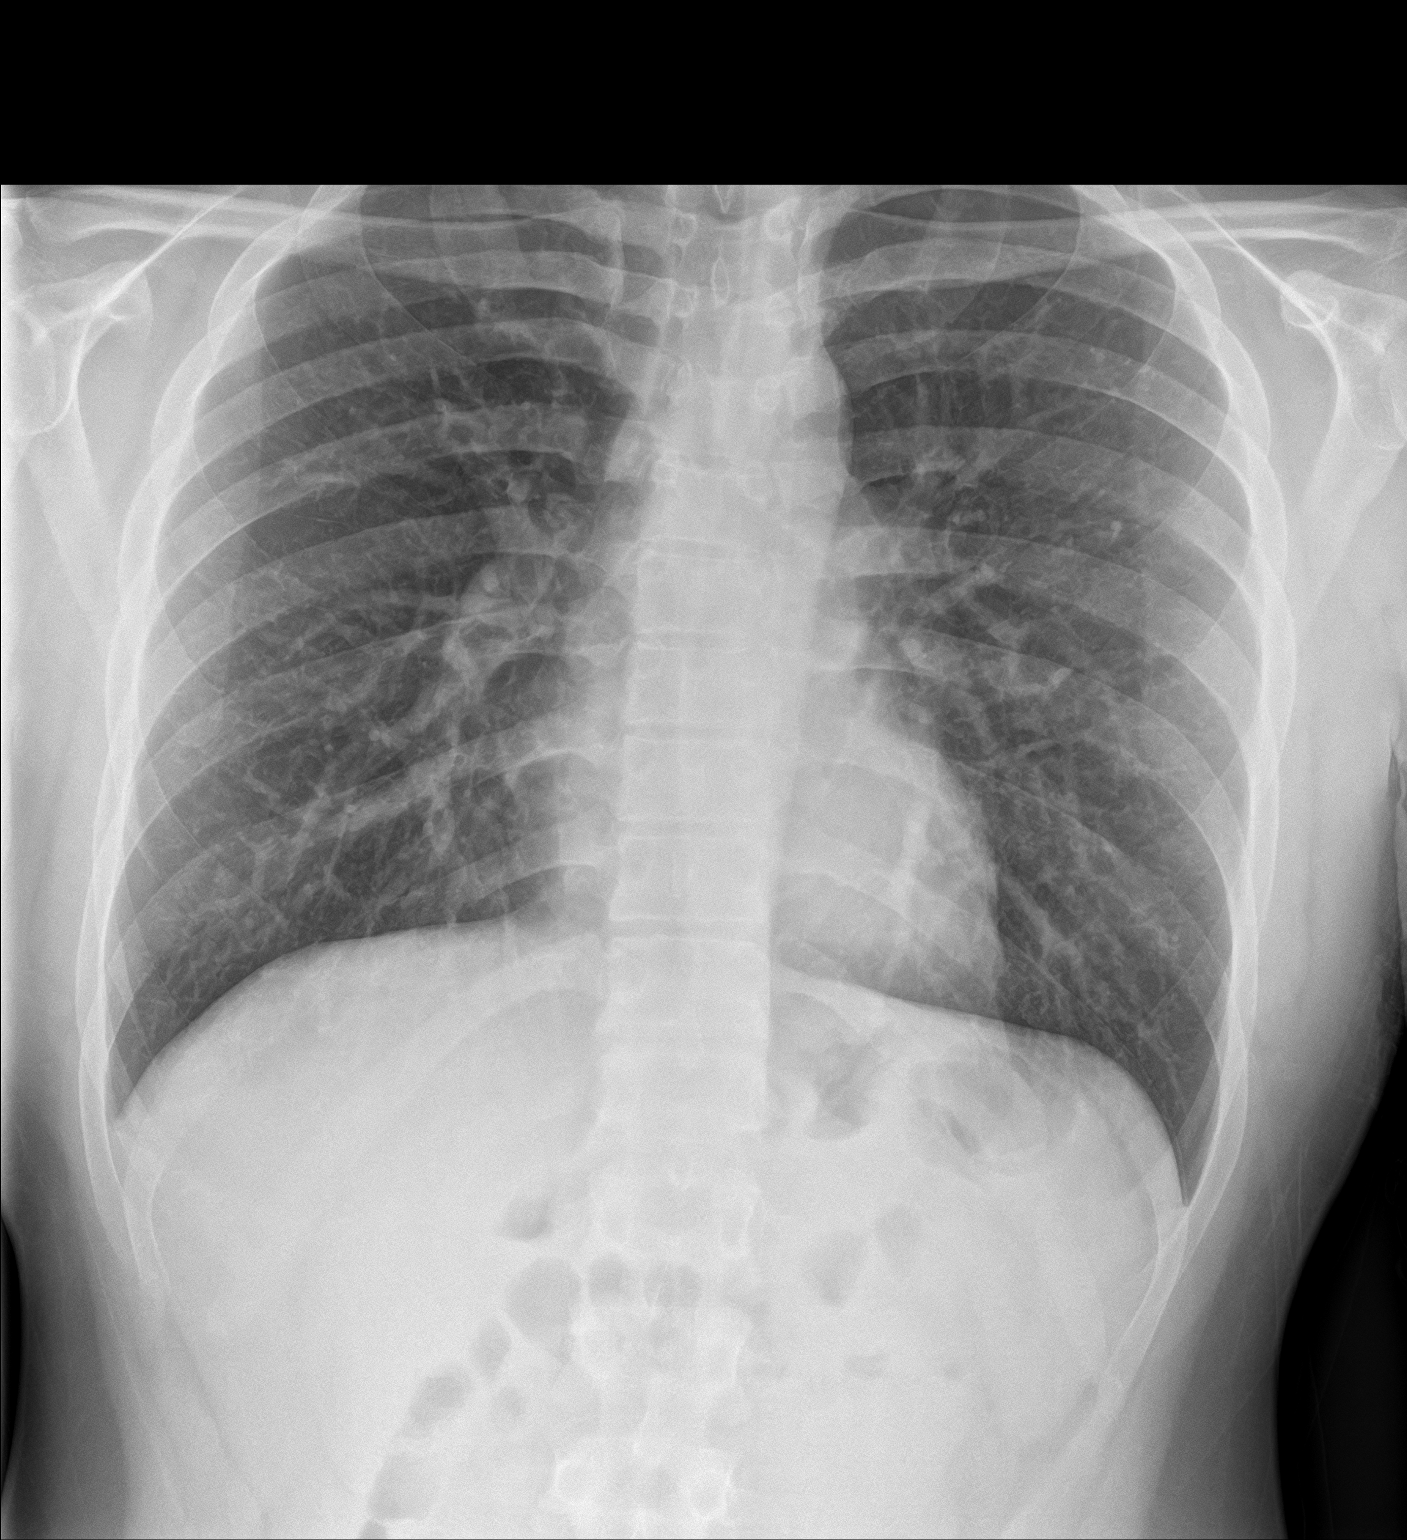
[im 2/2]
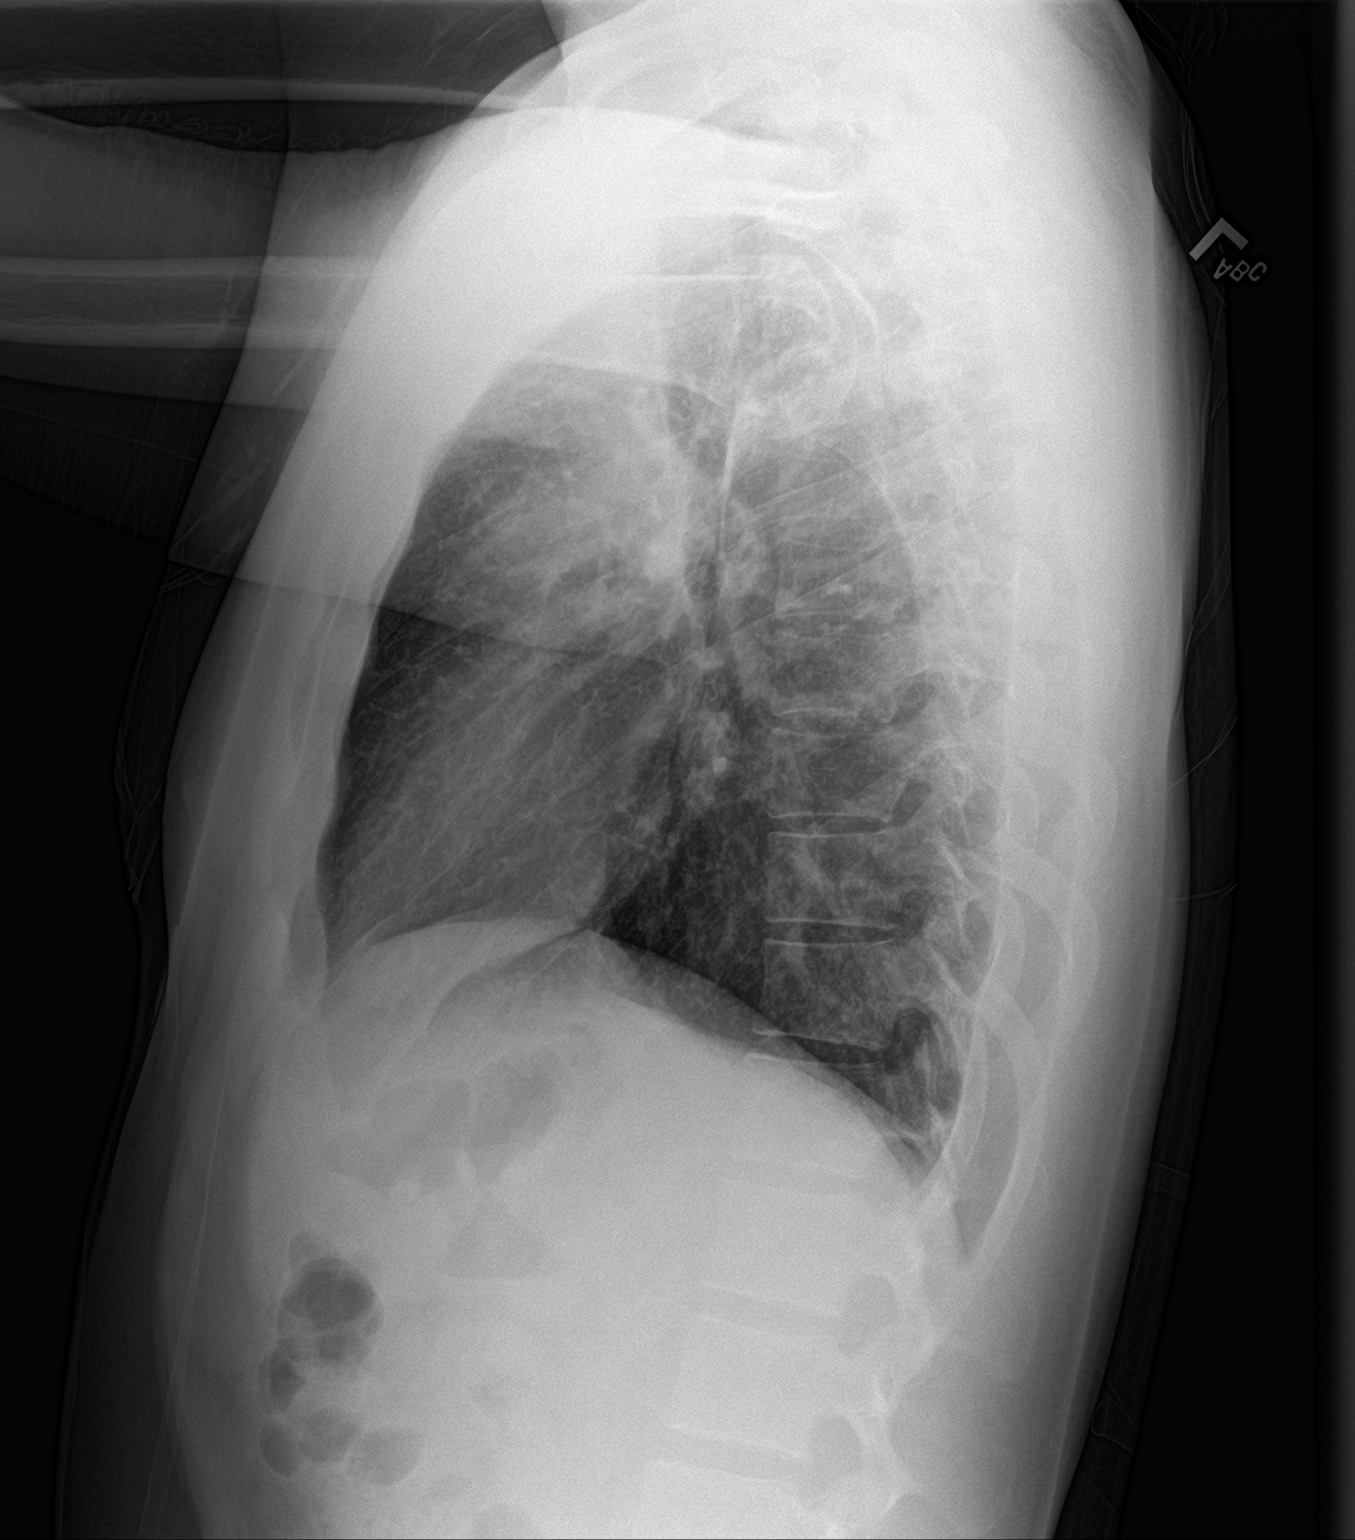

[2 of 2 positions shown; findings below may reference images not displayed]

FINDINGS: Heart and mediastinal contours are within normal limits. No focal
opacities or effusions. No acute bony abnormality.
IMPRESSION: No active cardiopulmonary disease.

## 2022-02-28 DIAGNOSIS — R69 Illness, unspecified: Secondary | ICD-10-CM | POA: Diagnosis not present

## 2022-02-28 DIAGNOSIS — Z0000 Encounter for general adult medical examination without abnormal findings: Secondary | ICD-10-CM | POA: Diagnosis not present

## 2022-02-28 DIAGNOSIS — R109 Unspecified abdominal pain: Secondary | ICD-10-CM | POA: Diagnosis not present

## 2022-02-28 DIAGNOSIS — Z1331 Encounter for screening for depression: Secondary | ICD-10-CM | POA: Diagnosis not present

## 2022-02-28 DIAGNOSIS — R197 Diarrhea, unspecified: Secondary | ICD-10-CM | POA: Diagnosis not present

## 2022-05-15 DIAGNOSIS — R17 Unspecified jaundice: Secondary | ICD-10-CM | POA: Diagnosis not present

## 2022-05-18 DIAGNOSIS — K625 Hemorrhage of anus and rectum: Secondary | ICD-10-CM | POA: Diagnosis not present

## 2022-05-18 DIAGNOSIS — Z8619 Personal history of other infectious and parasitic diseases: Secondary | ICD-10-CM | POA: Diagnosis not present
